# Patient Record
Sex: Female | Born: 1975 | Race: White | Hispanic: No | Marital: Married | Smoking: Never smoker
Health system: Southern US, Community
[De-identification: ages and names within clinical notes are randomized; demographics above are authoritative.]

## PROBLEM LIST (undated history)

## (undated) HISTORY — PX: CHOLECYSTECTOMY, LAPAROSCOPIC: SHX56

---
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
## 2013-11-29 NOTE — L&D Delivery Note (Signed)
Operative Delivery Note At 3:19 AM a viable and healthy female was delivered via Vaginal, Vacuum Investment banker, operational(Extractor).  Presentation: vertex; Position: Right,, Occiput,, Anterior; Station: +4.  Verbal consent: obtained from patient.  Risks and benefits discussed in detail.  Risks include, but are not limited to the risks of anesthesia, bleeding, infection, damage to maternal tissues, fetal cephalhematoma.  There is also the risk of inability to effect vaginal delivery of the head, or shoulder dystocia that cannot be resolved by established maneuvers, leading to the need for emergency cesarean section.  APGAR: , ; weight .   Placenta status: Intact, Spontaneous.   Cord:  with the following complications: .  Cord pH: na  Anesthesia: Epidural  Instruments: Kiwi x one pull Episiotomy: None Lacerations: second Suture Repair: 2.0 vicryl rapide Est. Blood Loss (mL): 200  Mom to postpartum.  Baby to Couplet care / Skin to Skin.  Linda Phillips J 07/03/2014, 3:38 AM

## 2013-11-30 LAB — OB RESULTS CONSOLE ANTIBODY SCREEN: Antibody Screen: NEGATIVE

## 2013-11-30 LAB — OB RESULTS CONSOLE HEPATITIS B SURFACE ANTIGEN: Hepatitis B Surface Ag: NEGATIVE

## 2013-11-30 LAB — OB RESULTS CONSOLE RUBELLA ANTIBODY, IGM: Rubella: IMMUNE

## 2013-11-30 LAB — OB RESULTS CONSOLE HIV ANTIBODY (ROUTINE TESTING): HIV: NONREACTIVE

## 2013-11-30 LAB — OB RESULTS CONSOLE ABO/RH: RH TYPE: POSITIVE

## 2013-11-30 LAB — OB RESULTS CONSOLE GC/CHLAMYDIA
Chlamydia: NEGATIVE
Gonorrhea: NEGATIVE

## 2013-11-30 LAB — OB RESULTS CONSOLE RPR: RPR: NONREACTIVE

## 2014-05-29 LAB — OB RESULTS CONSOLE GBS: STREP GROUP B AG: NEGATIVE

## 2014-07-03 ENCOUNTER — Encounter (HOSPITAL_COMMUNITY): Payer: 59 | Admitting: Anesthesiology

## 2014-07-03 ENCOUNTER — Encounter (HOSPITAL_COMMUNITY): Payer: Self-pay | Admitting: *Deleted

## 2014-07-03 ENCOUNTER — Inpatient Hospital Stay (HOSPITAL_COMMUNITY)
Admission: AD | Admit: 2014-07-03 | Discharge: 2014-07-04 | DRG: 775 | Disposition: A | Discharge status: Home / Self Care | Payer: 59 | Source: Ambulatory Visit | Attending: Obstetrics and Gynecology | Admitting: Obstetrics and Gynecology

## 2014-07-03 ENCOUNTER — Inpatient Hospital Stay (HOSPITAL_COMMUNITY): Payer: 59 | Admitting: Anesthesiology

## 2014-07-03 DIAGNOSIS — Z8759 Personal history of other complications of pregnancy, childbirth and the puerperium: Secondary | ICD-10-CM

## 2014-07-03 DIAGNOSIS — O09529 Supervision of elderly multigravida, unspecified trimester: Secondary | ICD-10-CM | POA: Diagnosis present

## 2014-07-03 DIAGNOSIS — IMO0001 Reserved for inherently not codable concepts without codable children: Secondary | ICD-10-CM

## 2014-07-03 DIAGNOSIS — O479 False labor, unspecified: Secondary | ICD-10-CM | POA: Diagnosis present

## 2014-07-03 LAB — CBC
HEMATOCRIT: 37.6 % (ref 36.0–46.0)
Hemoglobin: 13.5 g/dL (ref 12.0–15.0)
MCH: 32.8 pg (ref 26.0–34.0)
MCHC: 35.9 g/dL (ref 30.0–36.0)
MCV: 91.3 fL (ref 78.0–100.0)
Platelets: 177 10*3/uL (ref 150–400)
RBC: 4.12 MIL/uL (ref 3.87–5.11)
RDW: 13.2 % (ref 11.5–15.5)
WBC: 12.7 10*3/uL — ABNORMAL HIGH (ref 4.0–10.5)

## 2014-07-03 LAB — RPR

## 2014-07-03 LAB — ABO/RH: ABO/RH(D): O POS

## 2014-07-03 MED ORDER — OXYTOCIN BOLUS FROM INFUSION
500.0000 mL | INTRAVENOUS | Status: DC
  Administered 2014-07-03: 500 mL via INTRAVENOUS

## 2014-07-03 MED ORDER — LACTATED RINGERS IV SOLN
500.0000 mL | Freq: Once | INTRAVENOUS | Status: AC
  Administered 2014-07-03: 500 mL via INTRAVENOUS

## 2014-07-03 MED ORDER — OXYCODONE-ACETAMINOPHEN 5-325 MG PO TABS: 1.0000 | ORAL_TABLET | ORAL | Status: DC | PRN

## 2014-07-03 MED ORDER — METHYLERGONOVINE MALEATE 0.2 MG PO TABS: 0.2000 mg | ORAL_TABLET | ORAL | Status: DC | PRN

## 2014-07-03 MED ORDER — BUTORPHANOL TARTRATE 1 MG/ML IJ SOLN
1.0000 mg | INTRAMUSCULAR | Status: DC | PRN
  Administered 2014-07-03: 1 mg via INTRAVENOUS

## 2014-07-03 MED ORDER — ONDANSETRON HCL 4 MG/2ML IJ SOLN: 4.0000 mg | INTRAMUSCULAR | Status: DC | PRN

## 2014-07-03 MED ORDER — FENTANYL 2.5 MCG/ML BUPIVACAINE 1/10 % EPIDURAL INFUSION (WH - ANES): 14.0000 mL/h | INTRAMUSCULAR | Status: DC | PRN

## 2014-07-03 MED ORDER — DIBUCAINE 1 % RE OINT: 1.0000 "application " | TOPICAL_OINTMENT | RECTAL | Status: DC | PRN

## 2014-07-03 MED ORDER — ONDANSETRON HCL 4 MG/2ML IJ SOLN: 4.0000 mg | Freq: Four times a day (QID) | INTRAMUSCULAR | Status: DC | PRN

## 2014-07-03 MED ORDER — LIDOCAINE HCL (PF) 1 % IJ SOLN
30.0000 mL | INTRAMUSCULAR | Status: DC | PRN
  Administered 2014-07-03: 30 mL via SUBCUTANEOUS
  Filled 2014-07-03: qty 30

## 2014-07-03 MED ORDER — LIDOCAINE HCL (PF) 1 % IJ SOLN
INTRAMUSCULAR | Status: DC | PRN
  Administered 2014-07-03 (×2): 5 mL

## 2014-07-03 MED ORDER — FLEET ENEMA 7-19 GM/118ML RE ENEM: 1.0000 | ENEMA | RECTAL | Status: DC | PRN

## 2014-07-03 MED ORDER — DIPHENHYDRAMINE HCL 25 MG PO CAPS: 25.0000 mg | ORAL_CAPSULE | Freq: Four times a day (QID) | ORAL | Status: DC | PRN

## 2014-07-03 MED ORDER — IBUPROFEN 600 MG PO TABS
600.0000 mg | ORAL_TABLET | Freq: Four times a day (QID) | ORAL | Status: DC
  Administered 2014-07-03 – 2014-07-04 (×6): 600 mg via ORAL
  Filled 2014-07-03 (×5): qty 1

## 2014-07-03 MED ORDER — EPHEDRINE 5 MG/ML INJ
10.0000 mg | INTRAVENOUS | Status: DC | PRN
  Filled 2014-07-03: qty 2

## 2014-07-03 MED ORDER — LACTATED RINGERS IV SOLN: 500.0000 mL | INTRAVENOUS | Status: DC | PRN

## 2014-07-03 MED ORDER — BUTORPHANOL TARTRATE 1 MG/ML IJ SOLN
INTRAMUSCULAR | Status: AC
  Filled 2014-07-03: qty 1

## 2014-07-03 MED ORDER — BENZOCAINE-MENTHOL 20-0.5 % EX AERO
1.0000 "application " | INHALATION_SPRAY | CUTANEOUS | Status: DC | PRN
  Administered 2014-07-03: 1 via TOPICAL

## 2014-07-03 MED ORDER — LACTATED RINGERS IV SOLN
INTRAVENOUS | Status: DC
  Administered 2014-07-03: 02:00:00 via INTRAVENOUS

## 2014-07-03 MED ORDER — TETANUS-DIPHTH-ACELL PERTUSSIS 5-2.5-18.5 LF-MCG/0.5 IM SUSP: 0.5000 mL | Freq: Once | INTRAMUSCULAR | Status: DC

## 2014-07-03 MED ORDER — BENZOCAINE-MENTHOL 20-0.5 % EX AERO
INHALATION_SPRAY | CUTANEOUS | Status: AC
  Filled 2014-07-03: qty 56

## 2014-07-03 MED ORDER — PRENATAL MULTIVITAMIN CH
1.0000 | ORAL_TABLET | Freq: Every day | ORAL | Status: DC
  Administered 2014-07-03 – 2014-07-04 (×2): 1 via ORAL
  Filled 2014-07-03 (×2): qty 1

## 2014-07-03 MED ORDER — FENTANYL 2.5 MCG/ML BUPIVACAINE 1/10 % EPIDURAL INFUSION (WH - ANES)
14.0000 mL/h | INTRAMUSCULAR | Status: DC | PRN
  Administered 2014-07-03: 14 mL/h via EPIDURAL

## 2014-07-03 MED ORDER — FENTANYL 2.5 MCG/ML BUPIVACAINE 1/10 % EPIDURAL INFUSION (WH - ANES)
INTRAMUSCULAR | Status: DC | PRN
  Administered 2014-07-03: 14 mL/h via EPIDURAL

## 2014-07-03 MED ORDER — CITRIC ACID-SODIUM CITRATE 334-500 MG/5ML PO SOLN: 30.0000 mL | ORAL | Status: DC | PRN

## 2014-07-03 MED ORDER — SIMETHICONE 80 MG PO CHEW: 80.0000 mg | CHEWABLE_TABLET | ORAL | Status: DC | PRN

## 2014-07-03 MED ORDER — ZOLPIDEM TARTRATE 5 MG PO TABS: 5.0000 mg | ORAL_TABLET | Freq: Every evening | ORAL | Status: DC | PRN

## 2014-07-03 MED ORDER — ONDANSETRON HCL 4 MG PO TABS: 4.0000 mg | ORAL_TABLET | ORAL | Status: DC | PRN

## 2014-07-03 MED ORDER — LANOLIN HYDROUS EX OINT: TOPICAL_OINTMENT | CUTANEOUS | Status: DC | PRN

## 2014-07-03 MED ORDER — IBUPROFEN 600 MG PO TABS
600.0000 mg | ORAL_TABLET | Freq: Four times a day (QID) | ORAL | Status: DC | PRN
Start: 2014-07-03 — End: 2014-07-03

## 2014-07-03 MED ORDER — SENNOSIDES-DOCUSATE SODIUM 8.6-50 MG PO TABS
2.0000 | ORAL_TABLET | ORAL | Status: DC
  Administered 2014-07-03: 2 via ORAL
  Filled 2014-07-03: qty 2

## 2014-07-03 MED ORDER — WITCH HAZEL-GLYCERIN EX PADS: 1.0000 "application " | MEDICATED_PAD | CUTANEOUS | Status: DC | PRN

## 2014-07-03 MED ORDER — PHENYLEPHRINE 40 MCG/ML (10ML) SYRINGE FOR IV PUSH (FOR BLOOD PRESSURE SUPPORT)
PREFILLED_SYRINGE | INTRAVENOUS | Status: AC
  Filled 2014-07-03: qty 10

## 2014-07-03 MED ORDER — FENTANYL 2.5 MCG/ML BUPIVACAINE 1/10 % EPIDURAL INFUSION (WH - ANES)
INTRAMUSCULAR | Status: AC
  Filled 2014-07-03: qty 125

## 2014-07-03 MED ORDER — PHENYLEPHRINE 40 MCG/ML (10ML) SYRINGE FOR IV PUSH (FOR BLOOD PRESSURE SUPPORT)
80.0000 ug | PREFILLED_SYRINGE | INTRAVENOUS | Status: DC | PRN
  Filled 2014-07-03: qty 2

## 2014-07-03 MED ORDER — PHENYLEPHRINE 40 MCG/ML (10ML) SYRINGE FOR IV PUSH (FOR BLOOD PRESSURE SUPPORT)
80.0000 ug | PREFILLED_SYRINGE | INTRAVENOUS | Status: DC | PRN
Start: 2014-07-03 — End: 2014-07-03
  Filled 2014-07-03: qty 2

## 2014-07-03 MED ORDER — OXYTOCIN 40 UNITS IN LACTATED RINGERS INFUSION - SIMPLE MED
62.5000 mL/h | INTRAVENOUS | Status: DC
  Filled 2014-07-03: qty 1000

## 2014-07-03 MED ORDER — METHYLERGONOVINE MALEATE 0.2 MG/ML IJ SOLN: 0.2000 mg | INTRAMUSCULAR | Status: DC | PRN

## 2014-07-03 MED ORDER — ACETAMINOPHEN 325 MG PO TABS: 650.0000 mg | ORAL_TABLET | ORAL | Status: DC | PRN

## 2014-07-03 MED ORDER — DIPHENHYDRAMINE HCL 50 MG/ML IJ SOLN: 12.5000 mg | INTRAMUSCULAR | Status: DC | PRN

## 2014-07-03 NOTE — H&P (Signed)
Linda Phillips is a 38 y.o. female presenting for labor. Maternal Medical History:  Reason for admission: Contractions.   Contractions: Onset was less than 1 hour ago.   Frequency: regular.   Perceived severity is moderate.    Fetal activity: Perceived fetal activity is normal.   Last perceived fetal movement was within the past hour.    Prenatal complications: no prenatal complications Prenatal Complications - Diabetes: none.    OB History   Grav Para Term Preterm Abortions TAB SAB Ect Mult Living   3 1   1  1   1      History reviewed. No pertinent past medical history. History reviewed. No pertinent past surgical history. Family History: family history is not on file. Social History:  reports that she has never smoked. She has never used smokeless tobacco. She reports that she does not drink alcohol or use illicit drugs.   Prenatal Transfer Tool  Maternal Diabetes: No Genetic Screening: Normal Maternal Ultrasounds/Referrals: Normal Fetal Ultrasounds or other Referrals:  None Maternal Substance Abuse:  No Significant Maternal Medications:  None Significant Maternal Lab Results:  None Other Comments:  None  Review of Systems  All other systems reviewed and are negative.   Dilation: 10 Effacement (%): 100 Station: +2 Exam by:: Bertram MillardJ. Lopez, RN Blood pressure 120/49, pulse 79, temperature 97.9 F (36.6 C), temperature source Oral, resp. rate 20, height 5\' 3"  (1.6 m), weight 71.668 kg (158 lb). Maternal Exam:  Uterine Assessment: Contraction strength is moderate.  Contraction frequency is irregular.   Abdomen: Patient reports no abdominal tenderness. Fetal presentation: vertex  Introitus: Normal vulva. Normal vagina.  Ferning test: not done.  Nitrazine test: not done. Amniotic fluid character: not assessed.  Pelvis: adequate for delivery.   Cervix: Cervix evaluated by digital exam.     Physical Exam  Constitutional: She is oriented to person, place, and time. She  appears well-developed and well-nourished.  HENT:  Head: Normocephalic and atraumatic.  Cardiovascular: Normal rate and regular rhythm.   Respiratory: Effort normal and breath sounds normal.  GI: Soft.  Genitourinary: Vagina normal and uterus normal.  Musculoskeletal: Normal range of motion.  Neurological: She is alert and oriented to person, place, and time.  Skin: Skin is warm and dry.    Prenatal labs: ABO, Rh:   Antibody:   Rubella:   RPR:    HBsAg:    HIV:    GBS: Negative (07/01 0000)   Assessment/Plan: Term IUP in active labor Expect SVD   [name] J 07/03/2014, 3:35 AM

## 2014-07-03 NOTE — Progress Notes (Signed)
Pt screaming for pain medication. FHR obtained and monitors immediately removed for transfer to L/D suite.

## 2014-07-03 NOTE — Anesthesia Preprocedure Evaluation (Signed)
Anesthesia Evaluation  Patient identified by MRN, date of birth, ID band Patient awake    Reviewed: Allergy & Precautions, H&P , NPO status , Patient's Chart, lab work & pertinent test results  Airway Mallampati: II TM Distance: >3 FB Neck ROM: Full    Dental no notable dental hx.    Pulmonary neg pulmonary ROS,  breath sounds clear to auscultation  Pulmonary exam normal       Cardiovascular negative cardio ROS  Rhythm:Regular Rate:Normal     Neuro/Psych negative neurological ROS  negative psych ROS   GI/Hepatic negative GI ROS, Neg liver ROS,   Endo/Other  negative endocrine ROS  Renal/GU negative Renal ROS     Musculoskeletal negative musculoskeletal ROS (+)   Abdominal   Peds  Hematology negative hematology ROS (+)   Anesthesia Other Findings   Reproductive/Obstetrics negative OB ROS                           Anesthesia Physical Anesthesia Plan  ASA: II  Anesthesia Plan: Epidural   Post-op Pain Management:    Induction:   Airway Management Planned:   Additional Equipment:   Intra-op Plan:   Post-operative Plan:   Informed Consent: I have reviewed the patients History and Physical, chart, labs and discussed the procedure including the risks, benefits and alternatives for the proposed anesthesia with the patient or authorized representative who has indicated his/her understanding and acceptance.     Plan Discussed with:   Anesthesia Plan Comments:         Anesthesia Quick Evaluation

## 2014-07-03 NOTE — Progress Notes (Signed)
  Chart review from Transfer Prenatal Records:  Initial OB care start ay 6 week OB labs (10/2013): O positive / antibody screen - negative / Rubella - Immune / RPR - NR / HIV - NR                                 hgb 12.5 / hct 36.4 / plt 214                                 urine cx - negative                                 GC-CHL - negative  Third trimester labs (02/2014) : GTT-152 ( ABN) / 3hr GTT: 76-136-108-90 (NL) / HIV - NR / RPR-NR  Tdap booster given 03/2014  Records to be faxed from [facility] for copy to patient hospital chart today  Linda Phillips CNM [facility]

## 2014-07-03 NOTE — Anesthesia Postprocedure Evaluation (Signed)
  Anesthesia Post-op Note  Patient: Moody BruinsLauren Toenjes  Procedure(s) Performed: * No procedures listed *  Patient Location: Mother/Baby  Anesthesia Type:Epidural  Level of Consciousness: awake, alert , oriented and patient cooperative  Airway and Oxygen Therapy: Patient Spontanous Breathing  Post-op Pain: mild  Post-op Assessment: Post-op Vital signs reviewed, Patient's Cardiovascular Status Stable, Respiratory Function Stable, Patent Airway, No signs of Nausea or vomiting, Adequate PO intake, Pain level controlled, No headache, No backache, No residual numbness and No residual motor weakness  Post-op Vital Signs: Reviewed and stable  Last Vitals:  Filed Vitals:   07/03/14 1100  BP: 120/66  Pulse: 67  Temp: 36.4 C  Resp: 18    Complications: No apparent anesthesia complications

## 2014-07-03 NOTE — MAU Note (Signed)
I want drugs, states first child was induction, was 2 cm in office.  Possible leaking, Contractions every 2 min.

## 2014-07-03 NOTE — Anesthesia Procedure Notes (Signed)
Epidural Patient location during procedure: OB  Staffing Anesthesiologist: Lewie LoronGERMEROTH, Sitlaly Gudiel R Performed by: anesthesiologist   Preanesthetic Checklist Completed: patient identified, pre-op evaluation, timeout performed, IV checked, risks and benefits discussed and monitors and equipment checked  Epidural Patient position: sitting Prep: site prepped and draped and DuraPrep Patient monitoring: heart rate Approach: midline Injection technique: LOR air and LOR saline  Needle:  Needle type: Tuohy  Needle gauge: 17 G Needle length: 9 cm Needle insertion depth: 5 cm Catheter type: closed end flexible Catheter size: 19 Gauge Catheter at skin depth: 8 cm Test dose: negative  Assessment Sensory level: T8 Events: blood not aspirated, injection not painful, no injection resistance, negative IV test and no paresthesia  Additional Notes Reason for block:procedure for pain

## 2014-07-03 NOTE — Progress Notes (Signed)
Interval Note:  S: Feels well. Pain controlled with Motrin.  O: VSS  A: S/p VAVD  P: PPD #0     Continue routine postpartum care.

## 2014-07-03 NOTE — Addendum Note (Signed)
Addendum created 07/03/14 1631 by Collier FlowersElizabeth J Dorethy Tomey, CRNA   Modules edited: Charges VN, Notes Section   Notes Section:  File: [id number]

## 2014-07-03 NOTE — Progress Notes (Signed)
S:  Patient screaming every ctx - profanity between demands for "drugs" and "an epidural now"  O:  VS: Blood pressure 111/56, pulse 91, temperature 97.9 F (36.6 C), temperature source Oral, resp. rate 20, height 5\' 3"  (1.6 m), weight 71.668 kg (158 lb).        FHR : baseline 125 / variability moderate / accelerations + / no decelerations        Toco: contractions every 2-3 minutes / moderate to strong         Cervix : 8-9cm / 90% / vtx  / +1 station / bloody show        Membranes: clear fluid  A: active labor     FHR category 1     uncontrolled pain (received stadol 1mg  without any relief - epidural pending with anesthesia in emergency)  P: labor support     MD for delivery - anesthesia when available      Linda Phillips, Linda Phillips CNM, MSN, [facility] 07/03/2014, (late entry for 0210) 3:52 AM

## 2014-07-03 NOTE — Lactation Note (Addendum)
This note was copied from the chart of Linda Phillips. Lactation Consultation Note Initial visit at 13 hours of age.  Mom reports a few good feedings and denies pain. Mom reports a stooled diaper, but no void yet.  Mom is able to describe a deep latch.  Mom reports having a momentary of sadness with letdown with older child.  She reports it quickly passed and is aware it may happen again. Mom reports baby just finished a feeding and is waiting for older child to meet baby.   [facility] resources given and discussed.  Encouraged to feed with early cues on demand.  Early newborn behavior discussed.  Hand expression demonstrated with colostrum visible.  Mom to call for assist as needed.    Patient Name: Linda Phillips Today's Date: 07/03/2014 Reason for consult: Initial assessment   Maternal Data Has patient been taught Hand Expression?: Yes Does the patient have breastfeeding experience prior to this delivery?: Yes  Feeding Feeding Type: Breast Fed  LATCH Score/Interventions                Intervention(s): Breastfeeding basics reviewed     Lactation Tools Discussed/Used     Consult Status Consult Status: Follow-up Date: 07/04/14 Follow-up type: In-patient    Beverely RisenShoptaw, Arvella MerlesJana Lynn 07/03/2014, 4:56 PM

## 2014-07-04 LAB — CBC
HCT: 33.4 % — ABNORMAL LOW (ref 36.0–46.0)
Hemoglobin: 11.4 g/dL — ABNORMAL LOW (ref 12.0–15.0)
MCH: 31.8 pg (ref 26.0–34.0)
MCHC: 34.1 g/dL (ref 30.0–36.0)
MCV: 93 fL (ref 78.0–100.0)
PLATELETS: 146 10*3/uL — AB (ref 150–400)
RBC: 3.59 MIL/uL — ABNORMAL LOW (ref 3.87–5.11)
RDW: 13.6 % (ref 11.5–15.5)
WBC: 10.5 10*3/uL (ref 4.0–10.5)

## 2014-07-04 MED ORDER — OXYCODONE-ACETAMINOPHEN 5-325 MG PO TABS: 1.0000 | ORAL_TABLET | ORAL | Status: AC | PRN

## 2014-07-04 MED ORDER — IBUPROFEN 600 MG PO TABS: 600.0000 mg | ORAL_TABLET | Freq: Four times a day (QID) | ORAL | Status: AC

## 2014-07-04 NOTE — Discharge Summary (Signed)
Obstetric Discharge Summary  Reason for Admission: onset of labor Prenatal Procedures: none Intrapartum Procedures: spontaneous vaginal delivery, vacuum and epidural Postpartum Procedures: none Complications-Operative and Postpartum: 2nd degree perineal laceration Hemoglobin  Date Value Ref Range Status  07/04/2014 11.4* 12.0 - 15.0 g/dL Final     HCT  Date Value Ref Range Status  07/04/2014 33.4* 36.0 - 46.0 % Final    Physical Exam:  General: alert, cooperative and no distress Lochia: appropriate Uterine Fundus: firm Incision: healing well DVT Evaluation: No evidence of DVT seen on physical exam.  Discharge Diagnoses: Term Pregnancy-delivered  Discharge Information: Date: 07/04/2014 Activity: pelvic rest Diet: routine Medications: PNV, Ibuprofen and Percocet Condition: stable Instructions: refer to practice specific booklet Discharge to: home Follow-up Information   Follow up with Linda Phillips,Linda J, MD. Schedule an appointment as soon as possible for a visit in 6 weeks.   Specialty:  Obstetrics and Gynecology   Contact information:   [address] [place] [address] [phone]       Newborn Data: Live born female  Birth Weight: 8 lb 3 oz (3714 g) APGAR: 8, 9  Home with mother.  Marlinda MikeBAILEY, Linda Hove 07/04/2014, 1:56 PM

## 2014-07-04 NOTE — Progress Notes (Signed)
PPD 2 SVD  S:  Reports feeling well             Tolerating po/ No nausea or vomiting             Bleeding is moderate             Pain controlled with motrin and occasional perococet             Up ad lib / ambulatory / voiding QS  Newborn breast feeding  / Circumcision today  O:               VS: BP 112/64  Pulse 68  Temp(Src) 97.8 F (36.6 C) (Oral)  Resp 18  Ht 5\' 3"  (1.6 m)  Wt 71.668 kg (158 lb)  BMI 28.00 kg/m2  SpO2 97%  Breastfeeding? Unknown   LABS:              Recent Labs  07/03/14 0132 07/04/14 0615  WBC 12.7* 10.5  HGB 13.5 11.4*  PLT 177 146*               Blood type: --/--/O POS (08/05 0132)  Rubella: Immune (01/02 0000)                     I&O: Intake/Output     08/05 0701 - 08/06 0700 08/06 0701 - 08/07 0700   Blood     Total Output       Net                          Physical Exam:             Alert and oriented X3  Lungs: Clear and unlabored  Heart: regular rate and rhythm / no mumurs  Abdomen: soft, non-tender, non-distended              Fundus: firm, non-tender, U-1  Perineum: mild edema  Lochia: light  Extremities: no edema, no calf pain or tenderness    A: PPD # 2   Doing well - stable status  P: Routine post partum orders  DC home  Marlinda MikeBAILEY, TANYA CNM, MSN, FACNM 07/04/2014, 1:54 PM

## 2014-07-04 NOTE — Progress Notes (Signed)
Clinical Social Work Department PSYCHOSOCIAL ASSESSMENT - MATERNAL/CHILD 07/04/2014  Patient:  Linda Phillips, Linda Phillips  Account Number:  [id number]  Admit Date:  07/03/2014  Ardine Eng Name:   Ardean Larsen    Clinical Social Worker:  Lucita Ferrara, CLINICAL SOCIAL WORKER   Date/Time:  07/04/2014 09:30 AM  Date Referred:  07/03/2014   Referral source  Central Nursery     Referred reason  Depression/Anxiety   Other referral source:    I:  FAMILY / HOME ENVIRONMENT Child's legal guardian:  PARENT  Guardian - Name Guardian - Age Guardian - Address  Tameria Patti [place]on [address], [place] [address]  Shelly Flatten  same as above   Other household support members/support persons Name Relationship DOB  [place] 36 1/36 years old   Other support:   MOB shared that she has numerous family members that live in [place].  FOB stated that his family lives in [place], but are available and supportive.    II  PSYCHOSOCIAL DATA Information Source:  Family Interview  Occupational hygienist Employment:   MOB and FOB are fully employed and work from home.  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   Financial resources:  Multimedia programmer If [place]:    School / Grade:  N/A Music therapist / Child Services Coordination / Early Interventions:   N/A  Cultural issues impacting care:   None reprorted    III  STRENGTHS Strengths  Home prepared for Child (including basic supplies)  Supportive family/friends  Compliance with medical plan   Strength comment:    IV  RISK FACTORS AND CURRENT PROBLEMS Current Problem:  YES   Risk Factor & Current Problem Patient Issue Family Issue Risk Factor / Current Problem Comment  Mental Illness N N MOB reported history of depression in college.  She shared belief that it was situational, denied recent symptoms.    V  SOCIAL WORK ASSESSMENT CSW met with MOB in her room to complete  assessment. Consult ordered due to MOB history of depression.  FOB present throughout, MOB agreeable to completing assessment in his presence.  MOB and FOB receptive to assessment and intervention.  FOB and MOB attentive to baby Jerline Pain throughout, mood and affect noted to be appropriate throughout assessment.   MOB and FOB discussed excitement as they reflected upon thoughts and feelings with the birth of Jerline Pain.  They shared that they have numerous family members who are supportive, MOB stated that she plans on utilizing supports if needed.  MOB and FOB discussed recent move from [place] to [place], and expressed happiness that they are now close to family and will no longer have "harsh winters".  Per MOB and FOB, it has been a stressor as they prepared to move while pregnant, but discussed that they are settled and looking forward to being back in the [place].  MOB and FOB discussed gratitude for ability to work remotely from home which allow them to spend more time with Jerline Pain.  MOB and FOB confirmed that they are continuing same jobs, which helps provide a sense of stability as they adjust to having a newborn and a new home.  They shared belief that they are coping well with these multiple changes.    MOB acknowledges history of depression, but shared that it was numerous years ago while she was in college.  She shared belief that it was "situational", secondary to roommates.  She stated that she was prescribed Prozac  at that time, but once situation resolved, there was no longer a need for medication.  She denied other mental health history, denied history of post-partum depression and anxiety.  She reflected upon normal "baby blues", and shared that she feels better now post-partum since the FOB will be home working and she has numerous family members available if she needs to take a nap or another break.    CSW discussed availability of CSW throughout time at the hospital.  MOB and FOB shared  appreciation for visit, stated that they will utilize CSW if needed.   No barriers to discharge.    VI SOCIAL WORK PLAN Social Work Therapist, art  No Further Intervention Required / No Barriers to Discharge   Type of pt/family education:   Post-partum depression and anxiety   If child protective services report - county:   If child protective services report - date:   Information/referral to community resources comment:   N/A   Other social work plan:   None.

## 2014-10-01 ENCOUNTER — Encounter (HOSPITAL_COMMUNITY): Payer: Self-pay | Admitting: *Deleted

## 2016-06-11 DIAGNOSIS — Q7649 Other congenital malformations of spine, not associated with scoliosis: Secondary | ICD-10-CM | POA: Insufficient documentation

## 2016-06-11 DIAGNOSIS — M545 Low back pain, unspecified: Secondary | ICD-10-CM | POA: Insufficient documentation

## 2016-06-11 DIAGNOSIS — K8020 Calculus of gallbladder without cholecystitis without obstruction: Secondary | ICD-10-CM | POA: Insufficient documentation

## 2016-06-24 ENCOUNTER — Ambulatory Visit: Payer: Self-pay | Admitting: General Surgery

## 2016-08-27 ENCOUNTER — Other Ambulatory Visit: Payer: Self-pay | Admitting: Orthopaedic Surgery

## 2016-08-27 DIAGNOSIS — M461 Sacroiliitis, not elsewhere classified: Secondary | ICD-10-CM

## 2016-09-06 ENCOUNTER — Ambulatory Visit
Admission: RE | Admit: 2016-09-06 | Discharge: 2016-09-06 | Disposition: A | Discharge status: Home / Self Care | Payer: Commercial Managed Care - HMO | Source: Ambulatory Visit | Attending: Orthopaedic Surgery | Admitting: Orthopaedic Surgery

## 2016-09-06 DIAGNOSIS — M461 Sacroiliitis, not elsewhere classified: Secondary | ICD-10-CM

## 2016-09-08 ENCOUNTER — Other Ambulatory Visit: Payer: Self-pay | Admitting: General Surgery

## 2017-12-17 IMAGING — MR MR LUMBAR SPINE W/O CM
5 series · 44 of 48 positions shown · non-contrast
Comparison: None.

CLINICAL DATA: Sacroiliitis.

EXAM:
MRI LUMBAR SPINE WITHOUT CONTRAST
TECHNIQUE: Multiplanar, multisequence MR imaging of the lumbar spine was
performed. No intravenous contrast was administered.

[Series 3: T2 · sagittal · 4.0mm · 0.94mm/px · 6 of 13 slices shown (1 of 2)]
[im 1/13]
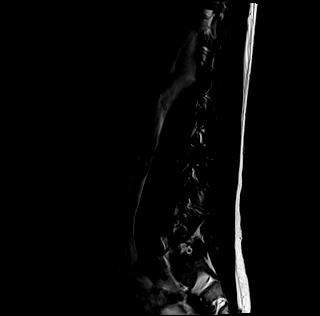
[im 3/13]
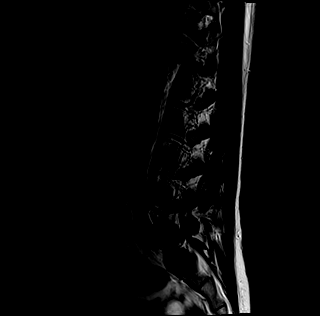
[im 5/13]
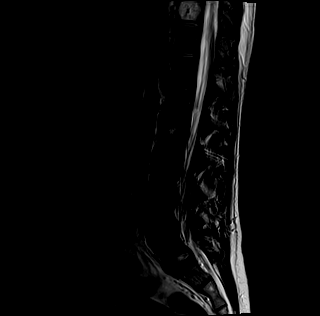
[im 8/13]
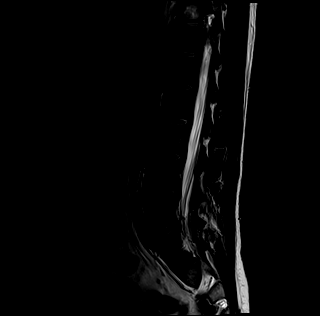
[im 10/13]
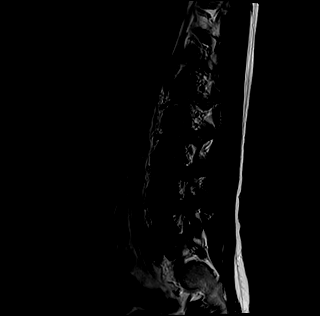
[im 13/13]
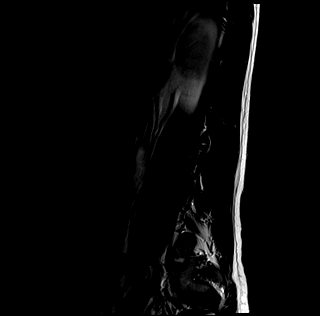

[Series 4: T1 · sagittal · 4.0mm · 0.94mm/px · 6 of 13 slices shown (1 of 2)]
[im 1/13]
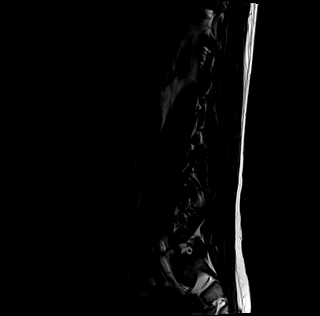
[im 3/13]
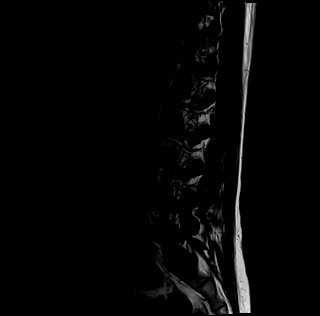
[im 5/13]
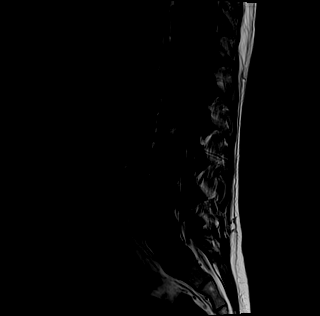
[im 8/13]
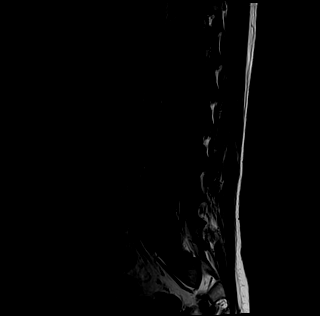
[im 10/13]
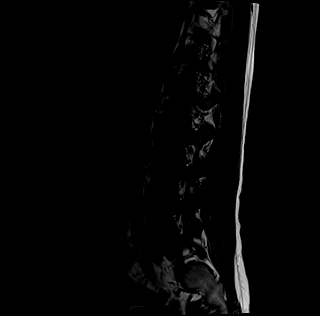
[im 13/13]
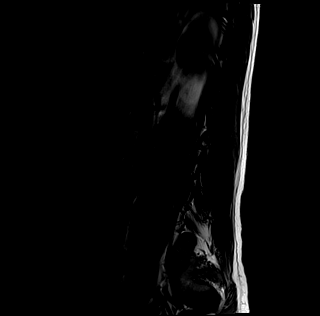

[Series 5: tirm sag · sagittal · 4.0mm · 0.59mm/px · 6 of 13 slices shown]
[im 1/13]
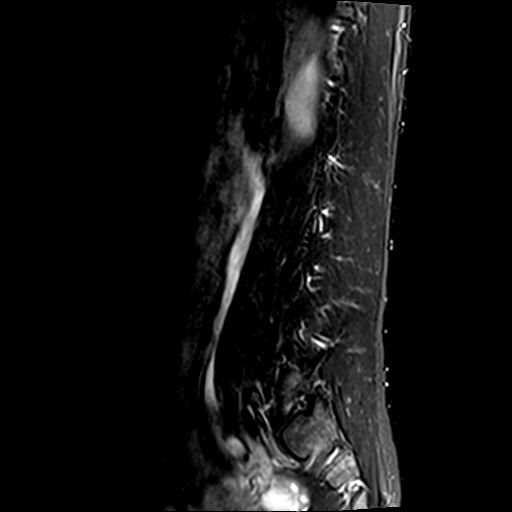
[im 3/13]
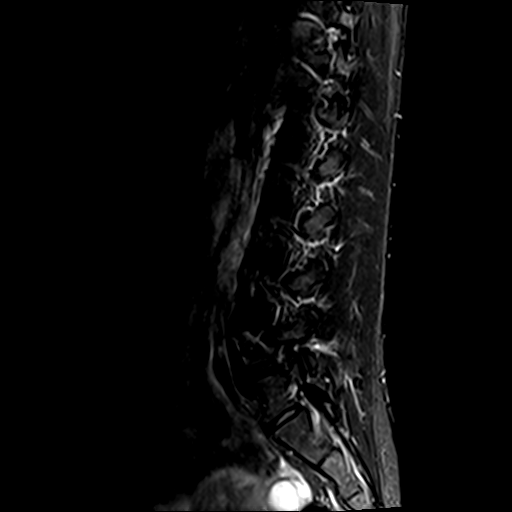
[im 5/13]
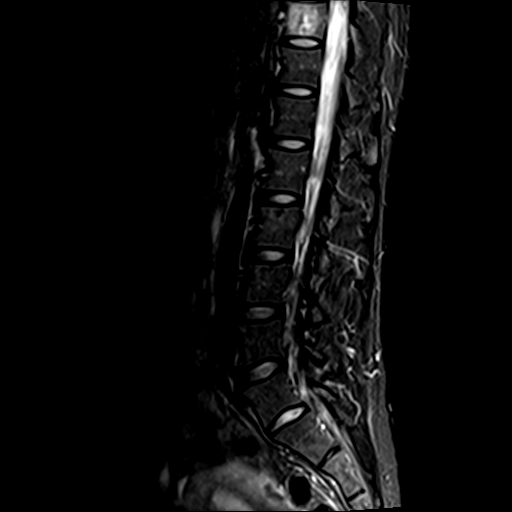
[im 8/13]
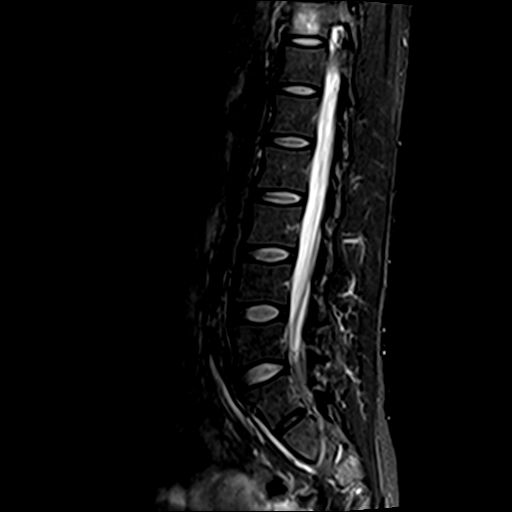
[im 10/13]
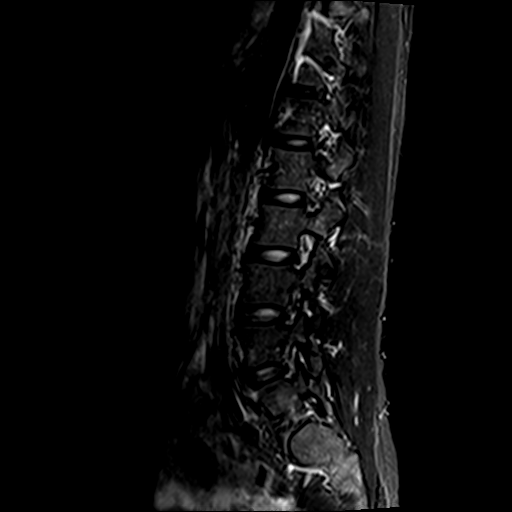
[im 13/13]
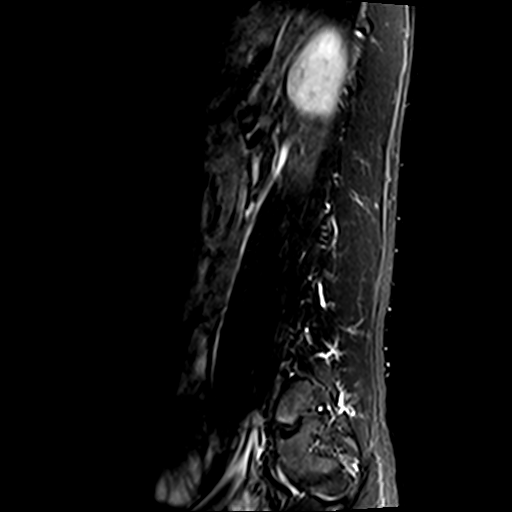

[Series 6: T1 · axial · 4.0mm · 0.78mm/px · z∈[-17,+144]mm · 11 of 31 slices shown (2 of 2)]
[im 1/31]
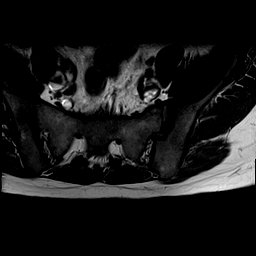
[im 3/31]
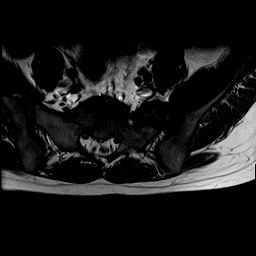
[im 5/31]
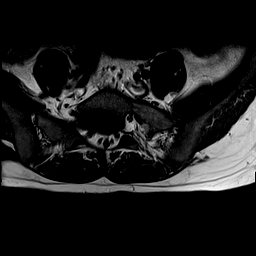
[im 7/31]
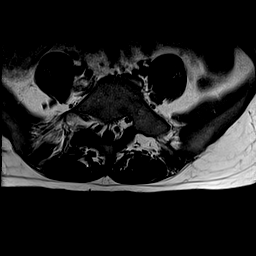
[im 9/31]
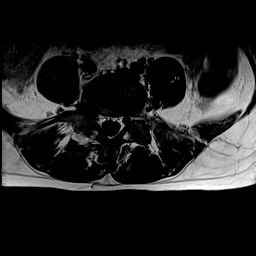
[im 13/31]
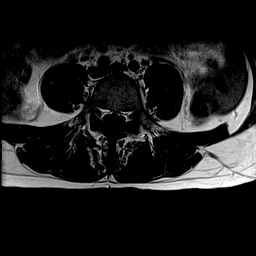
[im 16/31]
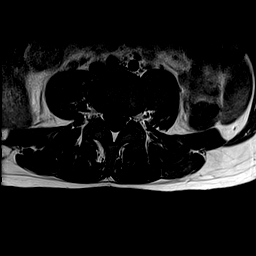
[im 18/31]
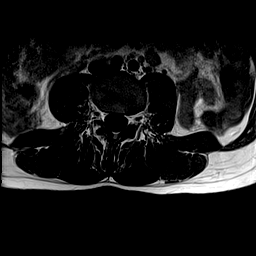
[im 22/31]
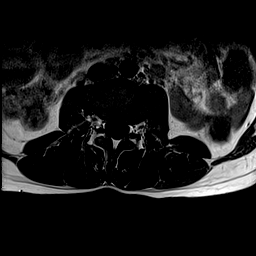
[im 26/31]
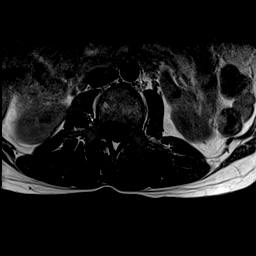
[im 31/31]
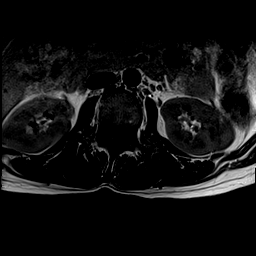

[Series 7: T2 · axial · 4.0mm · 0.78mm/px · z∈[-17,+144]mm · 15 of 31 slices shown (2 of 2)]
[im 1/31]
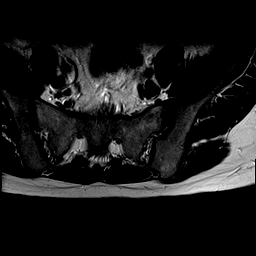
[im 3/31]
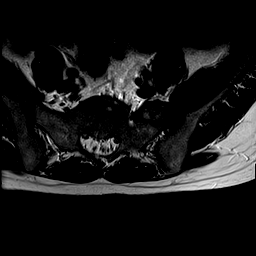
[im 5/31]
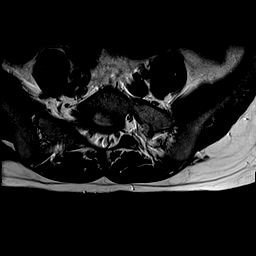
[im 7/31]
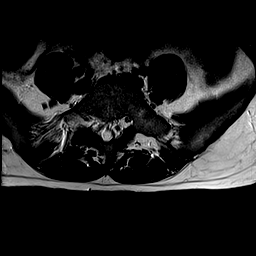
[im 9/31]
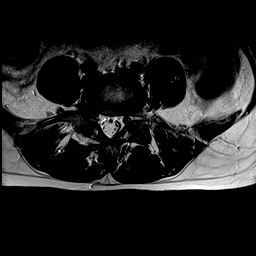
[im 11/31]
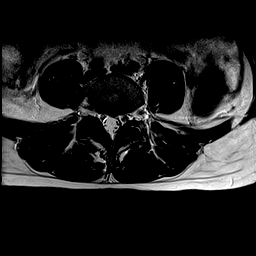
[im 13/31]
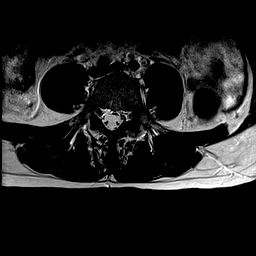
[im 16/31]
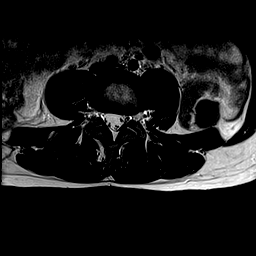
[im 18/31]
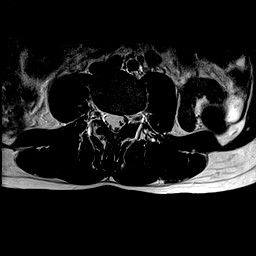
[im 20/31]
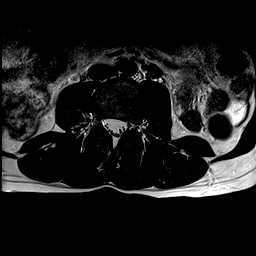
[im 22/31]
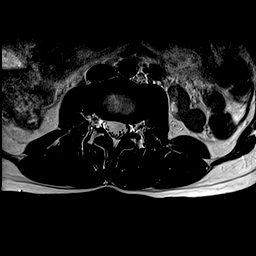
[im 24/31]
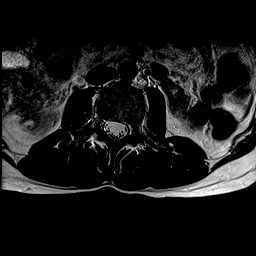
[im 26/31]
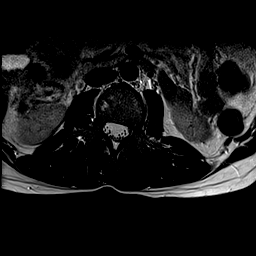
[im 28/31]
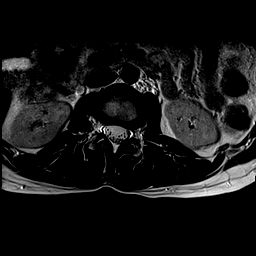
[im 31/31]
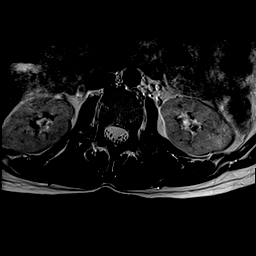

[44 of 48 positions shown; findings below may reference images not displayed]

FINDINGS: Segmentation: Based on the lowest ribs there is a transitional
lumbosacral vertebra with rudimentary disc space.

Alignment:  Straight lumbar spine without subluxation.

Vertebrae: Rounded T11 body lesion that has stippled central
appearance and some internal fat. Appearance consistent with
hemangioma. No posterior element extension or aggressive feature.

No endplate fatty changes or edema to suggest spondylitis. The
sacroiliac joints are partially visualized and negative shwere seen.
There is a partly seen pseudoarticulation between the transitional
S1 vertebra and the left sacrum, with medial spurring contacting and
mildly deforming the S1 nerve root in the anterior sacral foramen.

Conus medullaris: Extends to the L1 level and appears normal.

Paraspinal and other soft tissues: There appears to be layering hypo
intensity in the gallbladder, suspicious for calculi.

Disc levels:

No herniation or facet arthropathy.
IMPRESSION: 1. No spondylitis to correlate with sacroiliitis history.
2. Transitional S1 vertebra with pseudoarthrosis between the left
transverse process and sacrum. Medial spurring from this
articulation mildly deforms the S1 nerve root in the anterior sacral
foramen. Consider Bertolotti syndrome in addition to sacroiliitis.
3. Suspected cholelithiasis.

## 2018-08-17 ENCOUNTER — Encounter: Payer: Self-pay | Admitting: Orthopaedic Surgery

## 2018-08-17 ENCOUNTER — Ambulatory Visit: Payer: 59 | Admitting: Orthopaedic Surgery

## 2018-08-17 VITALS — BP 113/67 | HR 66 | Ht 63.0 in | Wt 137.0 lb

## 2018-08-17 DIAGNOSIS — S66911A Strain of unspecified muscle, fascia and tendon at wrist and hand level, right hand, initial encounter: Secondary | ICD-10-CM

## 2018-08-17 DIAGNOSIS — IMO0001 Reserved for inherently not codable concepts without codable children: Secondary | ICD-10-CM

## 2018-08-17 NOTE — Progress Notes (Signed)
Subjective:    Patient ID: Linda Phillips, female    DOB: 12/05/1975, 42 y.o.   MRN: [id number]  HPI She has had pain in the right long finger since mid June.  She noticed it after returning from a beach holiday.  She does not remember any trauma or unusual injury.  Her finger feels "jammed".  She has some slight swelling at the end of the day of the PIP joint area.  She has no redness, no numbness.  She has more pain after using it a lot.  She has not really done anything for it.  She has no other joint pains.   Review of Systems  Constitutional: Positive for activity change.  Musculoskeletal: Positive for arthralgias.  All other systems reviewed and are negative.  For Review of Systems, all other systems reviewed and are negative.  The following is a summary of the past history medically, past history surgically, known current medicines, social history and family history.  This information is gathered electronically by the computer from prior information and documentation.  I review this each visit and have found including this information at this point in the chart is beneficial and informative.   History reviewed. No pertinent past medical history.  Past Surgical History:  Procedure Laterality Date  . CHOLECYSTECTOMY, LAPAROSCOPIC    . HERNIA REPAIR  1980    Current Outpatient Medications on File Prior to Visit  Medication Sig Dispense Refill  . ibuprofen (ADVIL,MOTRIN) 600 MG tablet Take 1 tablet (600 mg total) by mouth every 6 (six) hours. 30 tablet 0  . omeprazole (PRILOSEC) 20 MG capsule Take 20 mg by mouth every evening.    Marland Kitchen. oxyCODONE-acetaminophen (PERCOCET/ROXICET) 5-325 MG per tablet Take 1 tablet by mouth every 4 (four) hours as needed for severe pain. 15 tablet 0  . Prenatal Vit-Fe Fumarate-FA (PRENATAL MULTIVITAMIN) TABS tablet Take 1 tablet by mouth daily at 12 noon.     No current facility-administered medications on file prior to visit.     Social History    Socioeconomic History  . Marital status: Married    Spouse name: Not on file  . Number of children: Not on file  . Years of education: Not on file  . Highest education level: Not on file  Occupational History  . Not on file  Social Needs  . Financial resource strain: Not on file  . Food insecurity:    Worry: Not on file    Inability: Not on file  . Transportation needs:    Medical: Not on file    Non-medical: Not on file  Tobacco Use  . Smoking status: Never Smoker  . Smokeless tobacco: Never Used  Substance and Sexual Activity  . Alcohol use: No  . Drug use: No  . Sexual activity: Not on file  Lifestyle  . Physical activity:    Days per week: Not on file    Minutes per session: Not on file  . Stress: Not on file  Relationships  . Social connections:    Talks on phone: Not on file    Gets together: Not on file    Attends religious service: Not on file    Active member of club or organization: Not on file    Attends meetings of clubs or organizations: Not on file    Relationship status: Not on file  . Intimate partner violence:    Fear of current or ex partner: Not on file    Emotionally abused: Not  on file    Physically abused: Not on file    Forced sexual activity: Not on file  Other Topics Concern  . Not on file  Social History Narrative  . Not on file    Family History  Problem Relation Age of Onset  . Cancer Mother   . Cancer Father   . Cancer Maternal Grandmother   . Cancer Paternal Grandmother   . Congestive Heart Failure Paternal Grandfather     BP 113/67   Pulse 66   Ht 5\' 3"  (1.6 m)   Wt 137 lb (62.1 kg)   BMI 24.27 kg/m   Body mass index is 24.27 kg/m.     Objective:   Physical Exam  Constitutional: She is oriented to person, place, and time. She appears well-developed and well-nourished.  HENT:  Head: Normocephalic and atraumatic.  Eyes: Pupils are equal, round, and reactive to light. Conjunctivae and EOM are normal.  Neck:  Normal range of motion. Neck supple.  Cardiovascular: Normal rate, regular rhythm and intact distal pulses.  Pulmonary/Chest: Effort normal.  Abdominal: Soft.  Musculoskeletal:       Right hand: She exhibits tenderness.       Hands: Neurological: She is alert and oriented to person, place, and time. She has normal reflexes. She displays normal reflexes. No cranial nerve deficit. She exhibits normal muscle tone. Coordination normal.  Skin: Skin is warm and dry.  Psychiatric: She has a normal mood and affect. Her behavior is normal. Judgment and thought content normal.     I have reviewed her notes from her family doctor and the x-ray report which was negative.     Assessment & Plan:   Encounter Diagnosis  Name Primary?  . Strain of right middle finger, initial encounter Yes   She has collateral ligament laxity of the right long finger at the PIP joint on the radial side.  I have explained the findings to her.  I have recommended buddy taping the finger when going to the gym or with sports.  I have recommended Aspercreme to the finger tid to qid.  I have recommended Aleve one bid for the next two weeks.  I will see her as needed.  Call if any problem.  Precautions discussed.   Electronically Signed Darreld Mclean, MD 9/19/201910:13 AM

## 2019-03-29 ENCOUNTER — Emergency Department (HOSPITAL_COMMUNITY)
Admission: EM | Admit: 2019-03-29 | Discharge: 2019-03-29 | Disposition: A | Discharge status: Home / Self Care | Payer: 59 | Attending: Emergency Medicine | Admitting: Emergency Medicine

## 2019-03-29 ENCOUNTER — Emergency Department (HOSPITAL_COMMUNITY): Payer: 59

## 2019-03-29 ENCOUNTER — Other Ambulatory Visit: Payer: Self-pay

## 2019-03-29 ENCOUNTER — Encounter (HOSPITAL_COMMUNITY): Payer: Self-pay | Admitting: Emergency Medicine

## 2019-03-29 DIAGNOSIS — S61216A Laceration without foreign body of right little finger without damage to nail, initial encounter: Secondary | ICD-10-CM | POA: Diagnosis not present

## 2019-03-29 DIAGNOSIS — Z23 Encounter for immunization: Secondary | ICD-10-CM | POA: Diagnosis not present

## 2019-03-29 DIAGNOSIS — W268XXA Contact with other sharp object(s), not elsewhere classified, initial encounter: Secondary | ICD-10-CM | POA: Insufficient documentation

## 2019-03-29 DIAGNOSIS — Y999 Unspecified external cause status: Secondary | ICD-10-CM | POA: Diagnosis not present

## 2019-03-29 DIAGNOSIS — Y939 Activity, unspecified: Secondary | ICD-10-CM | POA: Diagnosis not present

## 2019-03-29 DIAGNOSIS — Y92015 Private garage of single-family (private) house as the place of occurrence of the external cause: Secondary | ICD-10-CM | POA: Diagnosis not present

## 2019-03-29 LAB — POC URINE PREG, ED: Preg Test, Ur: NEGATIVE

## 2019-03-29 MED ORDER — DOXYCYCLINE HYCLATE 100 MG PO CAPS: 100.0000 mg | ORAL_CAPSULE | Freq: Two times a day (BID) | ORAL | 0 refills | Status: AC

## 2019-03-29 MED ORDER — TETANUS-DIPHTH-ACELL PERTUSSIS 5-2.5-18.5 LF-MCG/0.5 IM SUSP
0.5000 mL | Freq: Once | INTRAMUSCULAR | Status: AC
  Administered 2019-03-29: 0.5 mL via INTRAMUSCULAR
  Filled 2019-03-29: qty 0.5

## 2019-03-29 MED ORDER — HYDROCODONE-ACETAMINOPHEN 5-325 MG PO TABS
1.0000 | ORAL_TABLET | Freq: Once | ORAL | Status: AC
  Administered 2019-03-29: 1 via ORAL
  Filled 2019-03-29: qty 1

## 2019-03-29 MED ORDER — LIDOCAINE HCL (PF) 1 % IJ SOLN
5.0000 mL | Freq: Once | INTRAMUSCULAR | Status: AC
  Administered 2019-03-29: 5 mL via INTRADERMAL
  Filled 2019-03-29: qty 5
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
## 2019-03-29 NOTE — ED Notes (Signed)
PA at bedside.
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
## 2019-03-29 NOTE — Discharge Instructions (Addendum)
You have been diagnosed today with laceration of the right little finger.  At this time there does not appear to be the presence of an emergent medical condition, however there is always the potential for conditions to change. Please read and follow the below instructions.  Please return to the Emergency Department immediately for any new or worsening symptoms. Please be sure to follow up with your Primary Care Provider within one week regarding your visit today; please call their office to schedule an appointment even if you are feeling better for a follow-up visit. Please follow-up with a hand specialist within the next 3-4 days for reevaluation and follow-up.  You may follow-up with our on-call hand specialist Dr. Merlyn Lot, call his office today to schedule an appointment.  You may also follow-up with your own preferred hand specialist.  Either way I recommend that you call today to schedule a follow-up appointment for early next week. Please take the antibiotic doxycycline as prescribed for infection prophylaxis.  Return to the emergency department for any signs of infection including redness, swelling, drainage, worsening pain. Additionally it is important to use the finger splint as instructed to avoid contracture of the skin and finger.  Follow-up with hand specialist is important to ensure proper healing.  Your sutures will need to be removed in 7 days.  Your hand specialist, or primary care provider may remove the sutures.  If necessary may also return to the emergency department for wound recheck and suture removal.  Get help right away if: You develop severe swelling around your wound. You have pus or a bad smell coming from your wound. Your pain suddenly gets worse and is severe. You develop painful lumps near your wound or anywhere on your body. You have a red streak going away from your wound. The wound is on your hand or foot and: You cannot properly move a finger or toe. Your fingers  or toes look pale or bluish. You have numbness that is spreading down your hand, foot, fingers, or toes.  Please read the additional information packets attached to your discharge summary.  Do not take your medicine if  develop an itchy rash, swelling in your mouth or lips, or difficulty breathing.

## 2019-10-19 ENCOUNTER — Other Ambulatory Visit: Payer: Self-pay

## 2019-10-19 DIAGNOSIS — Z20822 Contact with and (suspected) exposure to covid-19: Secondary | ICD-10-CM

## 2019-10-21 LAB — NOVEL CORONAVIRUS, NAA: SARS-CoV-2, NAA: NOT DETECTED

## 2019-12-11 ENCOUNTER — Ambulatory Visit: Payer: 59 | Attending: Internal Medicine

## 2019-12-11 ENCOUNTER — Encounter: Payer: Self-pay | Admitting: Orthopaedic Surgery

## 2019-12-11 ENCOUNTER — Other Ambulatory Visit: Payer: Self-pay

## 2019-12-11 ENCOUNTER — Ambulatory Visit: Payer: 59 | Admitting: Orthopaedic Surgery

## 2019-12-11 ENCOUNTER — Ambulatory Visit: Payer: 59

## 2019-12-11 VITALS — BP 130/74 | HR 73 | Temp 97.9°F | Ht 63.0 in | Wt 139.2 lb

## 2019-12-11 DIAGNOSIS — M25571 Pain in right ankle and joints of right foot: Secondary | ICD-10-CM

## 2019-12-11 DIAGNOSIS — Z20822 Contact with and (suspected) exposure to covid-19: Secondary | ICD-10-CM

## 2019-12-11 NOTE — Progress Notes (Signed)
Patient PX:Linda Phillips, female DOB:[birth year], 44 y.o. SWN:[id number]  Chief Complaint  Patient presents with  . Ankle Pain    R/rolled it on ice while taking puppy out for walk/hurts, swollen some and bruised. Used ice and wrap    HPI  Linda Phillips is a 44 y.o. female who slipped on some ice a few days ago while walking her new puppy. She had pain in the lateral right ankle. She has elevated it, used ice, Advil and still has some pain.  She wanted to have it checked out.   She has no other problem.     Body mass index is 24.67 kg/m.  ROS  Review of Systems  Constitutional: Positive for activity change.  Musculoskeletal: Positive for arthralgias, gait problem and joint swelling.  All other systems reviewed and are negative.   All other systems reviewed and are negative.  The following is a summary of the past history medically, past history surgically, known current medicines, social history and family history.  This information is gathered electronically by the computer from prior information and documentation.  I review this each visit and have found including this information at this point in the chart is beneficial and informative.    History reviewed. No pertinent past medical history.  Past Surgical History:  Procedure Laterality Date  . CHOLECYSTECTOMY, LAPAROSCOPIC    . HERNIA REPAIR  1980    Family History  Problem Relation Age of Onset  . Cancer Mother   . Cancer Father   . Cancer Maternal Grandmother   . Cancer Paternal Grandmother   . Congestive Heart Failure Paternal Grandfather     Social History Social History   Tobacco Use  . Smoking status: Never Smoker  . Smokeless tobacco: Never Used  Substance Use Topics  . Alcohol use: No  . Drug use: No    Allergies  Allergen Reactions  . Amoxicillin-Pot Clavulanate     Other reaction(s): Unknown Upset stomach    Current Outpatient Medications  Medication Sig Dispense Refill  . fexofenadine  (ALLEGRA) 180 MG tablet Take 180 mg by mouth daily.    Marland Kitchen ibuprofen (ADVIL,MOTRIN) 600 MG tablet Take 1 tablet (600 mg total) by mouth every 6 (six) hours. (Patient not taking: Reported on 03/29/2019) 30 tablet 0  . oxyCODONE-acetaminophen (PERCOCET/ROXICET) 5-325 MG per tablet Take 1 tablet by mouth every 4 (four) hours as needed for severe pain. (Patient not taking: Reported on 03/29/2019) 15 tablet 0   No current facility-administered medications for this visit.     Physical Exam  Blood pressure 130/74, pulse 73, temperature 97.9 F (36.6 C), height 5\' 3"  (1.6 m), weight 139 lb 4 oz (63.2 kg), unknown if currently breastfeeding.  Constitutional: overall normal hygiene, normal nutrition, well developed, normal grooming, normal body habitus. Assistive device:none  Musculoskeletal: gait and station Limp right, muscle tone and strength are normal, no tremors or atrophy is present.  .  Neurological: coordination overall normal.  Deep tendon reflex/nerve stretch intact.  Sensation normal.  Cranial nerves II-XII intact.   Skin:   Normal overall no scars, lesions, ulcers or rashes. No psoriasis.  Psychiatric: Alert and oriented x 3.  Recent memory intact, remote memory unclear.  Normal mood and affect. Well groomed.  Good eye contact.  Cardiovascular: overall no swelling, no varicosities, no edema bilaterally, normal temperatures of the legs and arms, no clubbing, cyanosis and good capillary refill.  Lymphatic: palpation is normal.  Right ankle has pain and tenderness laterally with increased pain  over the anterior talofibular ligament.  ROM is good but painful.  NV intact.   All other systems reviewed and are negative   The patient has been educated about the nature of the problem(s) and counseled on treatment options.  The patient appeared to understand what I have discussed and is in agreement with it.  Encounter Diagnosis  Name Primary?  . Pain in right ankle and joints of right foot  Yes    PLAN Call if any problems.  Precautions discussed.  Continue current medications.   Return to clinic 3 weeks   An ankle lace up brace given.  Contrast Bath instructions given.  Electronically Signed Darreld Mclean, MD 1/12/202110:58 AM

## 2019-12-13 LAB — NOVEL CORONAVIRUS, NAA: SARS-CoV-2, NAA: NOT DETECTED

## 2020-01-01 ENCOUNTER — Encounter: Payer: Self-pay | Admitting: Orthopaedic Surgery

## 2020-01-01 ENCOUNTER — Other Ambulatory Visit: Payer: Self-pay

## 2020-01-01 ENCOUNTER — Ambulatory Visit: Payer: 59 | Admitting: Orthopaedic Surgery

## 2020-01-01 VITALS — Ht 63.0 in | Wt 141.0 lb

## 2020-01-01 DIAGNOSIS — M25571 Pain in right ankle and joints of right foot: Secondary | ICD-10-CM

## 2020-01-01 NOTE — Progress Notes (Signed)
My ankle is better  Her right ankle is improved. She has a different brace that she likes better.  She has no swelling.  She has only slight tenderness of the anterior talofibular ligament.  NV intact. ROM is full.  No limp.  Encounter Diagnosis  Name Primary?  . Pain in right ankle and joints of right foot Yes   I will see as needed.  Call if any problem.  Precautions discussed.   Electronically Signed Darreld Mclean, MD 2/2/20219:41 AM

## 2020-07-08 IMAGING — CR RIGHT HAND - COMPLETE 3+ VIEW
3 series · 3 of 3 positions shown · non-contrast
Comparison: None.

CLINICAL DATA: Right hand laceration.

EXAM:
RIGHT HAND - COMPLETE 3+ VIEW

[hand pa]
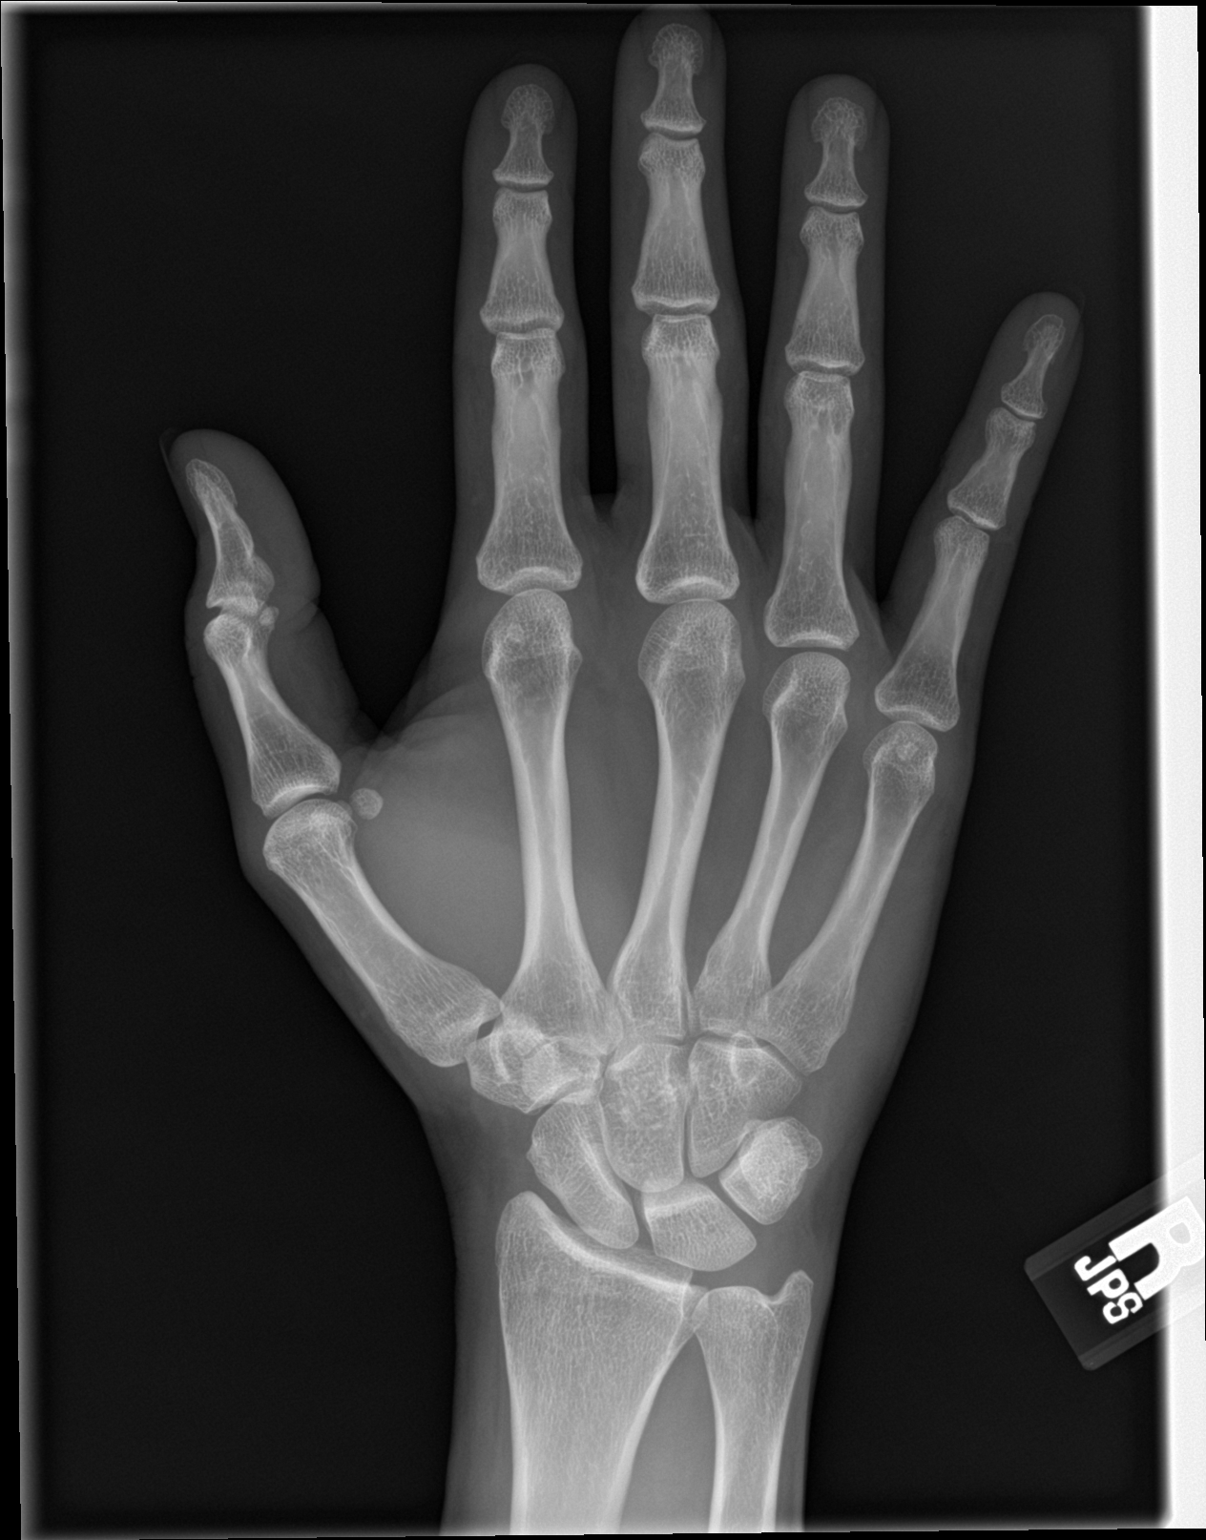

[hand obl]
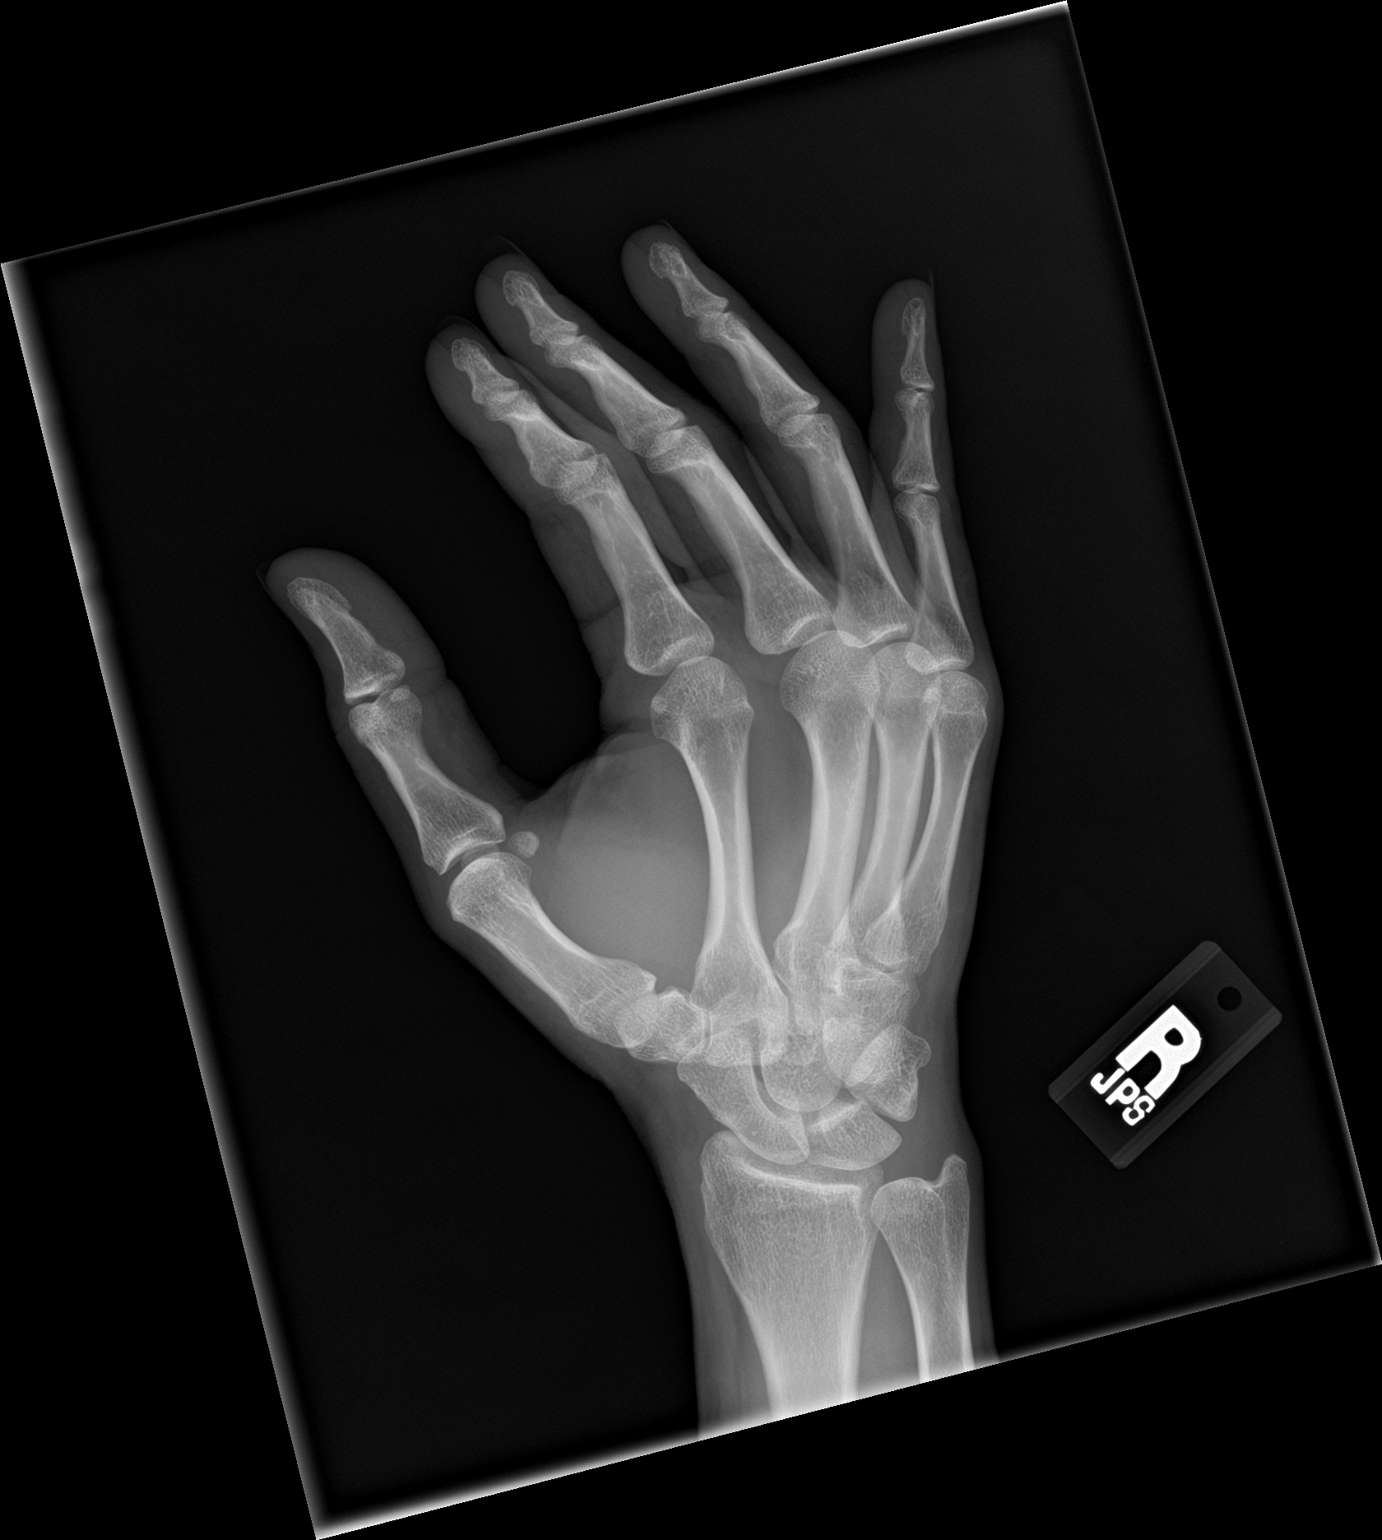

[hand lat]
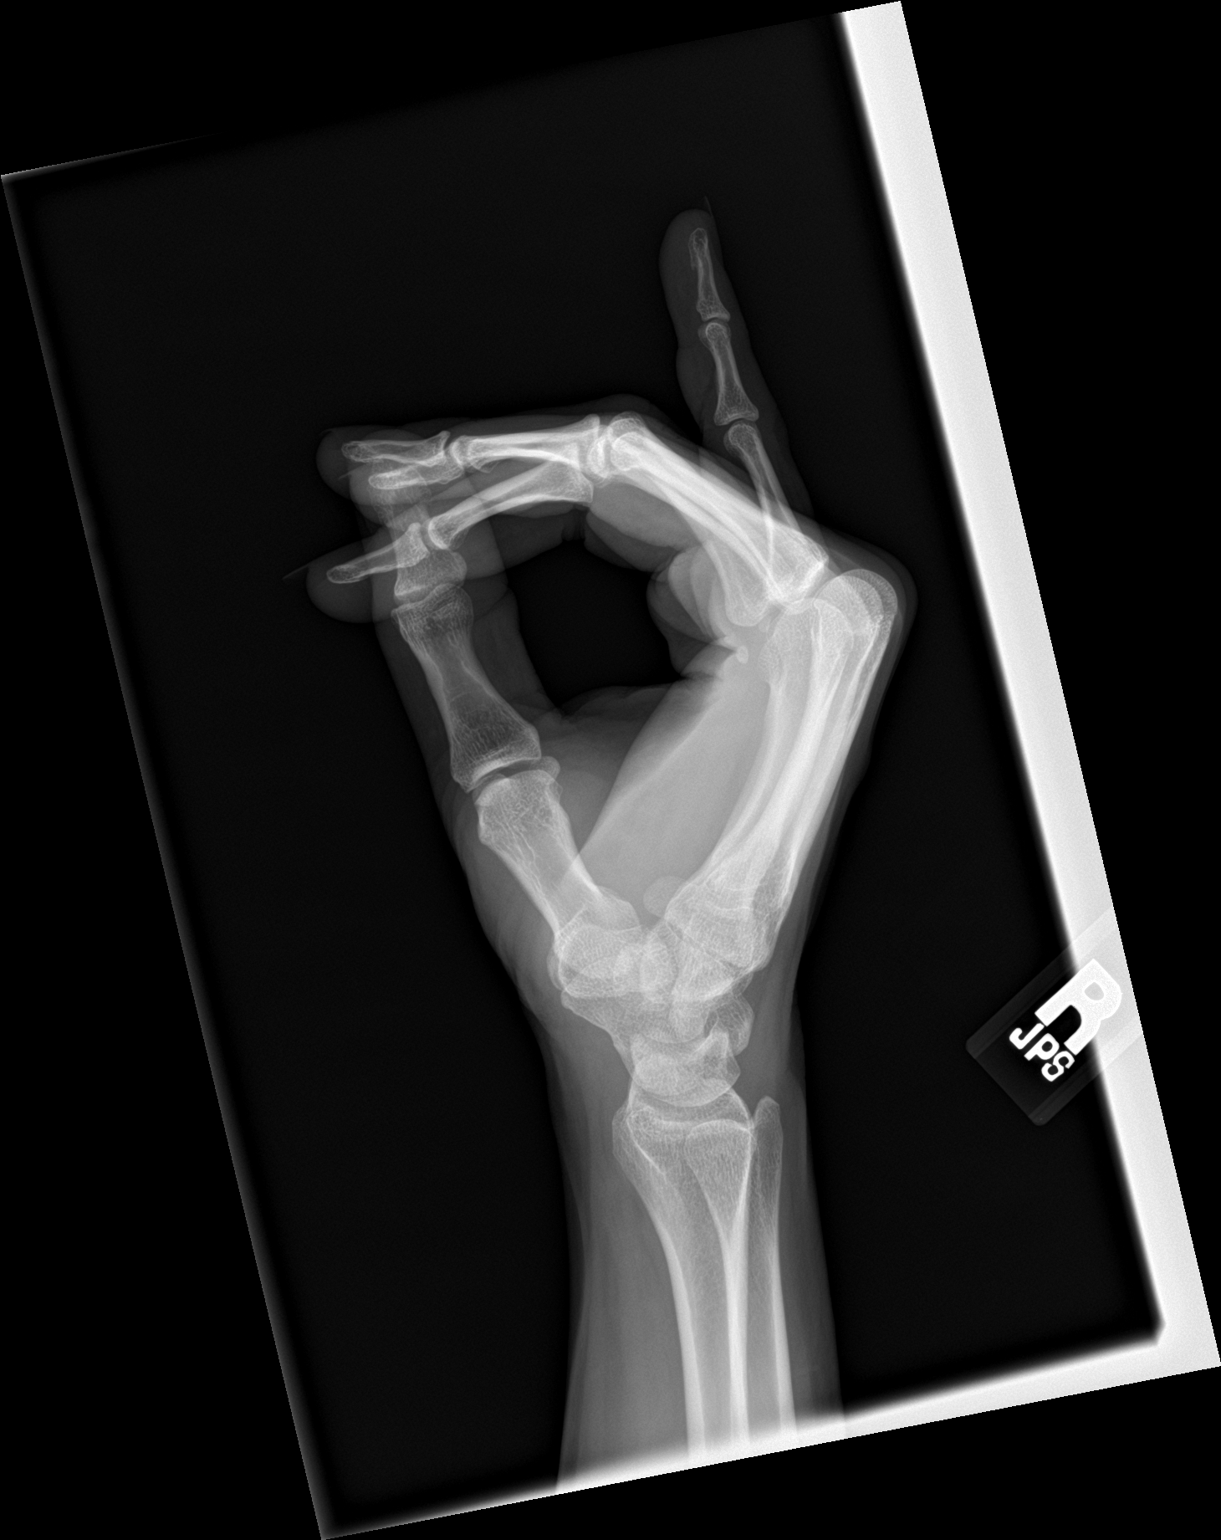

[3 of 3 positions shown; findings below may reference images not displayed]

FINDINGS: There is no evidence of fracture or dislocation. There is no
evidence of arthropathy or other focal bone abnormality. Soft
tissues are unremarkable.
IMPRESSION: Negative.

## 2024-09-11 ENCOUNTER — Encounter (INDEPENDENT_AMBULATORY_CARE_PROVIDER_SITE_OTHER): Payer: Self-pay

## 2024-10-31 ENCOUNTER — Encounter (INDEPENDENT_AMBULATORY_CARE_PROVIDER_SITE_OTHER): Payer: Self-pay | Admitting: Otolaryngology

## 2024-10-31 ENCOUNTER — Ambulatory Visit (INDEPENDENT_AMBULATORY_CARE_PROVIDER_SITE_OTHER): Admitting: Otolaryngology

## 2024-10-31 VITALS — BP 114/71 | HR 64 | Temp 98.3°F | Ht 63.0 in | Wt 135.0 lb

## 2024-10-31 DIAGNOSIS — J342 Deviated nasal septum: Secondary | ICD-10-CM | POA: Diagnosis not present

## 2024-10-31 DIAGNOSIS — J3489 Other specified disorders of nose and nasal sinuses: Secondary | ICD-10-CM

## 2024-10-31 DIAGNOSIS — J301 Allergic rhinitis due to pollen: Secondary | ICD-10-CM | POA: Diagnosis not present

## 2024-10-31 DIAGNOSIS — J343 Hypertrophy of nasal turbinates: Secondary | ICD-10-CM

## 2024-11-01 DIAGNOSIS — J343 Hypertrophy of nasal turbinates: Secondary | ICD-10-CM | POA: Insufficient documentation

## 2024-11-01 DIAGNOSIS — J309 Allergic rhinitis, unspecified: Secondary | ICD-10-CM | POA: Insufficient documentation

## 2024-11-01 DIAGNOSIS — J342 Deviated nasal septum: Secondary | ICD-10-CM | POA: Insufficient documentation

## 2024-11-01 NOTE — Progress Notes (Signed)
 CC: Chronic nasal obstruction  Discussed the use of AI scribe software for clinical note transcription with the patient, who gave verbal consent to proceed.  History of Present Illness Linda Phillips is a 48 year old female who presents with chronic nasal obstruction and suspected nasal polyps.  She experiences significant nasal congestion, particularly at night, which has been persistent. She uses Flonase intermittently, especially before outdoor activities.  She has a history of seasonal sinus infections, typically occurring once or twice a year, often triggered by weather changes. The last significant sinus infection occurred a year ago, which progressed to a double ear infection after air travel.  She has been receiving allergy shots for approximately five to six years, which initially seemed effective. However, she recently developed new allergies, confirmed by retesting, and her treatment regimen was adjusted accordingly.  Her current medication regimen includes Flonase, used every two to three days, and she has previously tried Allegra and Zyrtec with variable effectiveness.  No history of ear, nose, and throat surgeries.  History reviewed. No pertinent past medical history.  Past Surgical History:  Procedure Laterality Date   CHOLECYSTECTOMY, LAPAROSCOPIC     HERNIA REPAIR  1980    Family History  Problem Relation Age of Onset   Cancer Mother    Cancer Father    Cancer Maternal Grandmother    Cancer Paternal Grandmother    Congestive Heart Failure Paternal Grandfather     Social History:  reports that she has never smoked. She has never used smokeless tobacco. She reports that she does not drink alcohol and does not use drugs.  Allergies:  Allergies  Allergen Reactions   Amoxicillin-Pot Clavulanate     Other reaction(s): Unknown Upset stomach    Prior to Admission medications   Medication Sig Start Date End Date Taking? Authorizing Linda Phillips  DIVIGEL 1 MG/GM GEL  Apply 1 packet every day by transdermal route for 90 days. 10/08/24  Yes Linda Phillips, Historical, MD  fexofenadine (ALLEGRA) 180 MG tablet Take 180 mg by mouth daily.   Yes Linda Phillips, Historical, MD  fluticasone (FLONASE SENSIMIST) 27.5 MCG/SPRAY nasal spray Place 2 sprays into the nose daily. 08/28/24  Yes Linda Phillips, Historical, MD  ibuprofen  (ADVIL ,MOTRIN ) 600 MG tablet Take 1 tablet (600 mg total) by mouth every 6 (six) hours. 07/04/14  Yes Linda Phillips, Linda Phillips  oxyCODONE -acetaminophen  (PERCOCET/ROXICET) 5-325 MG per tablet Take 1 tablet by mouth every 4 (four) hours as needed for severe pain. Patient not taking: Reported on 10/31/2024 07/04/14   Linda Phillips, Linda Phillips    Blood pressure 114/71, pulse 64, temperature 98.3 F (36.8 C), temperature source Oral, height 5' 3 (1.6 m), weight 135 lb (61.2 kg), SpO2 96%, unknown if currently breastfeeding. Exam: General: Communicates without difficulty, well nourished, no acute distress. Head: Normocephalic, no evidence injury, no tenderness, facial buttresses intact without stepoff. Face/sinus: No tenderness to palpation and percussion. Facial movement is normal and symmetric. Eyes: PERRL, EOMI. No scleral icterus, conjunctivae clear. Neuro: CN II exam reveals vision grossly intact.  No nystagmus at any point of gaze. Ears: Auricles well formed without lesions.  Ear canals are intact without mass or lesion.  No erythema or edema is appreciated.  The TMs are intact without fluid. Nose: External evaluation reveals normal support and skin without lesions.  Dorsum is intact.  Anterior rhinoscopy reveals congested mucosa over anterior aspect of inferior turbinates and deviated septum.  No purulence noted. Oral:  Oral cavity and oropharynx are intact, symmetric, without erythema or edema.  Mucosa  is moist without lesions. Neck: Full range of motion without pain.  There is no significant lymphadenopathy.  No masses palpable.  Thyroid bed within normal limits to palpation.  Parotid  glands and submandibular glands equal bilaterally without mass.  Trachea is midline. Neuro:  CN 2-12 grossly intact.   Procedure:  Flexible Nasal Endoscopy: Description: Risks, benefits, and alternatives of flexible endoscopy were explained to the patient.  Specific mention was made of the risk of throat numbness with difficulty swallowing, possible bleeding from the nose and mouth, and pain from the procedure.  The patient gave oral consent to proceed.  The flexible scope was inserted into the right nasal cavity.  Endoscopy of the interior nasal cavity, superior, inferior, and middle meatus was performed. The sphenoid-ethmoid recess was examined. Edematous mucosa was noted.  No polyp, mass, or lesion was appreciated. Nasal septal deviation noted. Olfactory cleft was clear.  Nasopharynx was clear.  Turbinates were hypertrophied but without mass.  The procedure was repeated on the contralateral side with similar findings.  The patient tolerated the procedure well.    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 The septum is curved, more so on the right side, and the inferior turbinates are enlarged, contributing to the obstruction. No polyps were observed.  - Continue using Sensimist/Flonase nasal spray daily to minimize nasal swelling.  The importance of consistent daily use is discussed. - Will consider septoplasty and turbinate reduction if symptoms persist despite medical management.  Allergic rhinitis Chronic allergic rhinitis with nasal congestion and swelling, exacerbated by environmental allergens. She has been receiving allergy shots for 5-6 years, which initially helped but symptoms have worsened. No polyps were observed. - Continue allergy shots as per current regimen. - Use Sensimist nasal spray consistently to manage symptoms. - Consider alternative  nasal sprays such as Nasonex, Nasocort, or Rhinocort if needed. - Will reassess symptoms in two months to evaluate the effectiveness of current treatment.   Linda Phillips Linda Phillips 11/01/2024, 12:10 PM

## 2025-01-02 ENCOUNTER — Encounter (INDEPENDENT_AMBULATORY_CARE_PROVIDER_SITE_OTHER): Payer: Self-pay | Admitting: Otolaryngology

## 2025-01-02 ENCOUNTER — Ambulatory Visit (INDEPENDENT_AMBULATORY_CARE_PROVIDER_SITE_OTHER): Admitting: Otolaryngology

## 2025-01-02 VITALS — BP 117/70 | HR 64

## 2025-01-02 DIAGNOSIS — J342 Deviated nasal septum: Secondary | ICD-10-CM

## 2025-01-02 DIAGNOSIS — J3489 Other specified disorders of nose and nasal sinuses: Secondary | ICD-10-CM

## 2025-01-02 DIAGNOSIS — J343 Hypertrophy of nasal turbinates: Secondary | ICD-10-CM

## 2025-01-02 DIAGNOSIS — J309 Allergic rhinitis, unspecified: Secondary | ICD-10-CM

## 2025-01-02 DIAGNOSIS — J301 Allergic rhinitis due to pollen: Secondary | ICD-10-CM

## 2025-01-02 NOTE — Progress Notes (Signed)
 Patient ID: Linda Phillips, female   DOB: [birth year], 49 y.o.   MRN: [id number]  Follow up: Chronic nasal obstruction, deviated septum, bilateral turbinate hypertrophy  History of Present Illness Linda Phillips is a 49 year old female with chronic nasal obstruction due to deviated septum and turbinate hypertrophy who presents for follow-up of persistent nasal congestion and obstruction.  Over the past two months, she has experienced improvement in nasal congestion with regular use of intranasal corticosteroid spray. However, she notes a mild increase in symptoms over the last two nights, which she attributes to environmental dryness.   She reports increased nasal secretions with nose blowing. Application of a thin layer of Vaseline inside the nostrils at night provides symptomatic relief by the following day, and she finds saline gel helpful for maintaining nasal moisture.  She has been adherent to her Sensimist intranasal corticosteroid regimen, missing only occasional doses. She has no history of glaucoma. She has noticed improvement in her nasal breathing, though some swelling persists. Overall, she describes her nasal obstruction as improved compared to prior visits.   Exam: General: Communicates without difficulty, well nourished, no acute distress. Head: Normocephalic, no evidence injury, no tenderness, facial buttresses intact without stepoff. Face/sinus: No tenderness to palpation and percussion. Facial movement is normal and symmetric. Eyes: PERRL, EOMI. No scleral icterus, conjunctivae clear. Neuro: CN II exam reveals vision grossly intact.  No nystagmus at any point of gaze. Ears: Auricles well formed without lesions.  Ear canals are intact without mass or lesion.  No erythema or edema is appreciated.  The TMs are intact without fluid. Nose: External evaluation reveals normal support and skin without lesions.  Dorsum is intact.  Anterior rhinoscopy reveals congested mucosa over anterior aspect  of inferior turbinates and deviated septum.  No purulence noted. Oral:  Oral cavity and oropharynx are intact, symmetric, without erythema or edema.  Mucosa is moist without lesions. Neck: Full range of motion without pain.  There is no significant lymphadenopathy.  No masses palpable.  Thyroid bed within normal limits to palpation.  Parotid glands and submandibular glands equal bilaterally without mass.  Trachea is midline. Neuro:  CN 2-12 grossly intact.    Assessment and Plan Assessment & Plan Chronic nasal obstruction due to deviated septum and turbinate hypertrophy Chronic nasal obstruction secondary to deviated septum and turbinate hypertrophy with persistent but improved symptoms on medical management. She experiences intermittent congestion, particularly with dry air, but overall nasal airflow and soft tissue swelling have improved. Septoplasty with turbinate reduction was discussed as a surgical alternative if medical management becomes insufficient.  - Continue Sensimist nasal spray as previously prescribed. - Use AYR saline gel spray or saline irrigation to maintain nasal moisture, especially in dry conditions. - Educated on proper nasal spray technique to avoid septal mucosa and reduce risk of epistaxis. - Discussed septoplasty with turbinate reduction as a surgical option if medical management is inadequate. - Scheduled follow-up in three months to reassess symptoms during spring allergy season.  Allergic rhinitis Allergic rhinitis is well-controlled with daily fexofenadine and fluticasone nasal spray. Occasional breakthrough congestion is likely due to environmental factors. - Continue fexofenadine and fluticasone nasal spray for ongoing symptom control. - Use saline gel spray or irrigation intermittently to alleviate dryness and maintain mucosal hydration. - Follow up in three months to monitor symptom control during peak allergy season.

## 2025-04-01 ENCOUNTER — Ambulatory Visit (INDEPENDENT_AMBULATORY_CARE_PROVIDER_SITE_OTHER): Admitting: Otolaryngology
